# Patient Record
Sex: Male | Born: 1975 | Race: Black or African American | Hispanic: No | Marital: Single | Smoking: Former smoker
Health system: Southern US, Community
[De-identification: ages and names within clinical notes are randomized; demographics above are authoritative.]

---

## 2018-08-18 ENCOUNTER — Encounter (HOSPITAL_COMMUNITY): Payer: Self-pay

## 2018-08-18 ENCOUNTER — Other Ambulatory Visit: Payer: Self-pay

## 2018-08-18 ENCOUNTER — Emergency Department (HOSPITAL_COMMUNITY): Payer: Self-pay

## 2018-08-18 ENCOUNTER — Emergency Department (HOSPITAL_COMMUNITY)
Admission: EM | Admit: 2018-08-18 | Discharge: 2018-08-18 | Disposition: A | Discharge status: Home / Self Care | Payer: Self-pay | Attending: Emergency Medicine | Admitting: Emergency Medicine

## 2018-08-18 DIAGNOSIS — R0789 Other chest pain: Secondary | ICD-10-CM | POA: Insufficient documentation

## 2018-08-18 LAB — BASIC METABOLIC PANEL
Anion gap: 9 (ref 5–15)
BUN: 8 mg/dL (ref 6–20)
CO2: 21 mmol/L — ABNORMAL LOW (ref 22–32)
Calcium: 8.4 mg/dL — ABNORMAL LOW (ref 8.9–10.3)
Chloride: 104 mmol/L (ref 98–111)
Creatinine, Ser: 1.28 mg/dL — ABNORMAL HIGH (ref 0.61–1.24)
GFR calc Af Amer: >60 mL/min (ref >60)
GFR calc non Af Amer: >60 mL/min (ref >60)
Glucose, Bld: 126 mg/dL — ABNORMAL HIGH (ref 70–99)
Potassium: 3.4 mmol/L — ABNORMAL LOW (ref 3.5–5.1)
Sodium: 134 mmol/L — ABNORMAL LOW (ref 135–145)

## 2018-08-18 LAB — CBC
HCT: 43.6 % (ref 39.0–52.0)
Hemoglobin: 14.6 g/dL (ref 13.0–17.0)
MCH: 29.3 pg (ref 26.0–34.0)
MCHC: 33.5 g/dL (ref 30.0–36.0)
MCV: 87.4 fL (ref 80.0–100.0)
Platelets: 209 10*3/uL (ref 150–400)
RBC: 4.99 MIL/uL (ref 4.22–5.81)
RDW: 13.7 % (ref 11.5–15.5)
WBC: 2.8 10*3/uL — ABNORMAL LOW (ref 4.0–10.5)
nRBC: 0 % (ref 0.0–0.2)

## 2018-08-18 LAB — TROPONIN I (HIGH SENSITIVITY): Troponin I (High Sensitivity): 2 ng/L (ref <18)

## 2018-08-18 MED ORDER — NAPROXEN 250 MG PO TABS
500.0000 mg | ORAL_TABLET | Freq: Once | ORAL | Status: AC
  Administered 2018-08-18: 500 mg via ORAL
  Filled 2018-08-18: qty 2

## 2018-08-18 MED ORDER — NAPROXEN 500 MG PO TABS: 500.0000 mg | ORAL_TABLET | Freq: Two times a day (BID) | ORAL | 0 refills | Status: AC

## 2018-08-18 MED ORDER — SODIUM CHLORIDE 0.9% FLUSH: 3.0000 mL | Freq: Once | INTRAVENOUS | Status: DC

## 2018-08-18 NOTE — ED Notes (Signed)
Patient Alert and oriented to baseline. Stable and ambulatory to baseline. Patient verbalized understanding of the discharge instructions.  Patient belongings were taken by the patient.   

## 2018-08-18 NOTE — ED Triage Notes (Signed)
Pt reports chest soreness for months now but has gotten worse over the past few days, states it is worse with movement, when he sneezes or coughs. Pt a.o, nad noted at this time.

## 2018-08-18 NOTE — ED Provider Notes (Signed)
[facility]   CSN: [id number] Arrival date & time: 08/18/18  2030    History   Chief Complaint Chief Complaint  Patient presents with  . Chest Pain    HPI Cole Watkins is a 43 y.o. male.  HPI: A 43 year old patient presents for evaluation of chest pain. Initial onset of pain was more than 6 hours ago. The patient's chest pain is sharp and is not worse with exertion. The patient's chest pain is not middle- or left-sided, is not well-localized, is not described as heaviness/pressure/tightness and does not radiate to the arms/jaw/neck. The patient does not complain of nausea and denies diaphoresis. The patient has smoked in the past 90 days. The patient has no history of stroke, has no history of peripheral artery disease, denies any history of treated diabetes, has no relevant family history of coronary artery disease (first degree relative at less than age 41), is not hypertensive, has no history of hypercholesterolemia and does not have an elevated BMI (>=30).   HPI Pt has been having chest pain for the last 6 months.  The pain occurs daily.  It feels like a soreness.  Stretching, certain movements, sneezing and coughing all cause the pain.  No fevers.  No leg swelling.  No history of heart or lung disease.  History reviewed. No pertinent past medical history.  There are no active problems to display for this patient.   History reviewed. No pertinent surgical history.      Home Medications    Prior to Admission medications   Medication Sig Start Date End Date Taking? Authorizing Provider  ibuprofen (ADVIL) 200 MG tablet Take 400 mg by mouth every 6 (six) hours as needed for headache.   Yes [provider]  naproxen (NAPROSYN) 500 MG tablet Take 1 tablet (500 mg total) by mouth 2 (two) times daily. 08/18/18   Linwood DibblesKnapp, Deshon Koslowski, MD    Family History No family history on file.  Social History Social History   Tobacco Use   . Smoking status: Not on file  Substance Use Topics  . Alcohol use: Not on file  . Drug use: Not on file     Allergies   Patient has no allergy information on record.   Review of Systems Review of Systems  All other systems reviewed and are negative.    Physical Exam Updated Vital Signs BP 130/87   Pulse (!) 59   Temp 98.1 F (36.7 C) (Oral)   Resp 16   SpO2 100%   Physical Exam Vitals signs and nursing note reviewed.  Constitutional:      General: He is not in acute distress.    Appearance: He is well-developed.  HENT:     Head: Normocephalic and atraumatic.     Right Ear: External ear normal.     Left Ear: External ear normal.  Eyes:     General: No scleral icterus.       Right eye: No discharge.        Left eye: No discharge.     Conjunctiva/sclera: Conjunctivae normal.  Neck:     Musculoskeletal: Neck supple.     Trachea: No tracheal deviation.  Cardiovascular:     Rate and Rhythm: Normal rate and regular rhythm.  Pulmonary:     Effort: Pulmonary effort is normal. No respiratory distress.     Breath sounds: Normal breath sounds. No stridor. No wheezing or rales.  Chest:     Chest wall:  Tenderness present. No mass or deformity.  Abdominal:     General: Bowel sounds are normal. There is no distension.     Palpations: Abdomen is soft.     Tenderness: There is no abdominal tenderness. There is no guarding or rebound.  Musculoskeletal:        General: No tenderness.  Skin:    General: Skin is warm and dry.     Findings: No rash.  Neurological:     Mental Status: He is alert.     Cranial Nerves: No cranial nerve deficit (no facial droop, extraocular movements intact, no slurred speech).     Sensory: No sensory deficit.     Motor: No abnormal muscle tone or seizure activity.     Coordination: Coordination normal.      ED Treatments / Results  Labs (all labs ordered are listed, but only abnormal results are displayed) Labs Reviewed  BASIC  METABOLIC PANEL - Abnormal; Notable for the following components:      Result Value   Sodium 134 (*)    Potassium 3.4 (*)    CO2 21 (*)    Glucose, Bld 126 (*)    Creatinine, Ser 1.28 (*)    Calcium 8.4 (*)    All other components within normal limits  CBC - Abnormal; Notable for the following components:   WBC 2.8 (*)    All other components within normal limits  TROPONIN I (HIGH SENSITIVITY)    EKG EKG Interpretation  Date/Time:  Tuesday August 18 2018 20:40:56 EDT Ventricular Rate:  96 PR Interval:  156 QRS Duration: 76 QT Interval:  348 QTC Calculation: 439 R Axis:   84 Text Interpretation:  Normal sinus rhythm Biatrial enlargement Abnormal ECG No old tracing to compare Confirmed by Dorie Rank [phone]) on 08/18/2018 10:38:31 PM   Radiology Dg Chest 2 View  Result Date: 08/18/2018 CLINICAL DATA:  Central and left chest pain for the past 6 months, worse over the past few days. Worse with movement or when sneezing or coughing. Slight cough and shortness of breath for several days. Smoker. EXAM: CHEST - 2 VIEW COMPARISON:  Chest and right ribs report dated 09/17/2010. FINDINGS: The heart size and mediastinal contours are within normal limits. Both lungs are clear. The visualized skeletal structures are unremarkable. IMPRESSION: Normal examination. Electronically Signed   By: Claudie Revering M.D.   On: 08/18/2018 20:56    Procedures Procedures (including critical care time)  Medications Ordered in ED Medications  sodium chloride flush (NS) 0.9 % injection 3 mL (has no administration in time range)  naproxen (NAPROSYN) tablet 500 mg (has no administration in time range)     Initial Impression / Assessment and Plan / ED Course  I have reviewed the triage vital signs and the nursing notes.  Pertinent labs & imaging results that were available during my care of the patient were reviewed by me and considered in my medical decision making (see chart for details).   HEAR Score: 2   Patient has had 6 months of chest pain.  On exam he has chest wall tenderness.  His ED work-up is reassuring.  He is low risk hear score with negative troponin.  Chest x-ray does not show any acute abnormalities.  Low risk for PE and PERC negative.  Symptoms may be related to chest wall pain.  Plan on discharge home with a prescription for Naprosyn.  Discussed outpatient follow-up with a primary care doctor.  Final Clinical Impressions(s) / ED Diagnoses  Final diagnoses:  Chest wall pain    ED Discharge Orders         Ordered    naproxen (NAPROSYN) 500 MG tablet  2 times daily     08/18/18 2254           Linwood DibblesKnapp, Annette Liotta, MD 08/18/18 2259

## 2018-08-18 NOTE — Discharge Instructions (Addendum)
Take the medications as prescribed, follow-up with a primary care doctor as discussed

## 2020-02-29 IMAGING — DX CHEST - 2 VIEW
2 series · 2 of 2 positions shown · non-contrast
Comparison: Chest and right ribs report dated 09/17/2010.

CLINICAL DATA: Central and left chest pain for the past 6 months,
worse over the past few days. Worse with movement or when sneezing
or coughing. Slight cough and shortness of breath for several days.
Smoker.

EXAM:
CHEST - 2 VIEW

[chest pa]
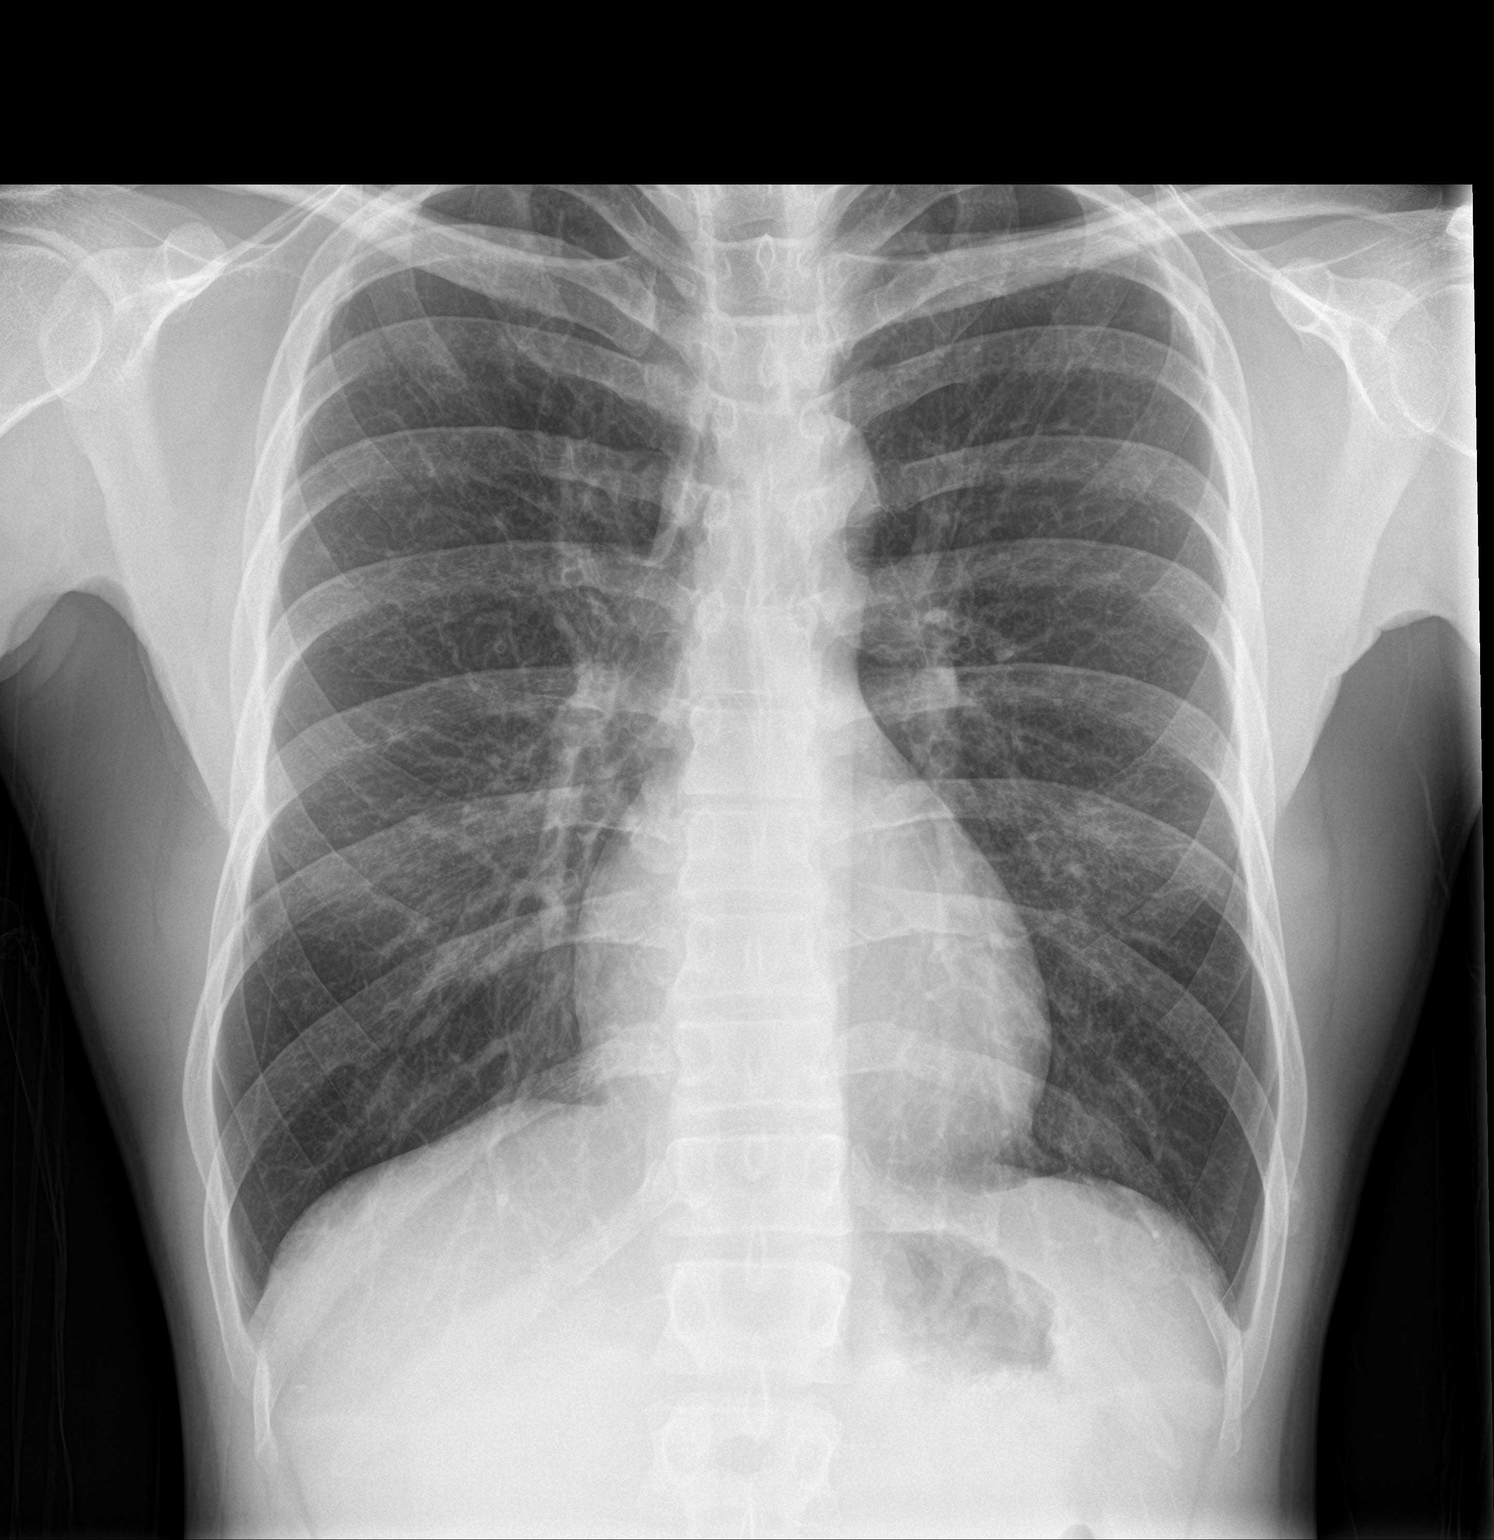

[chest lat]
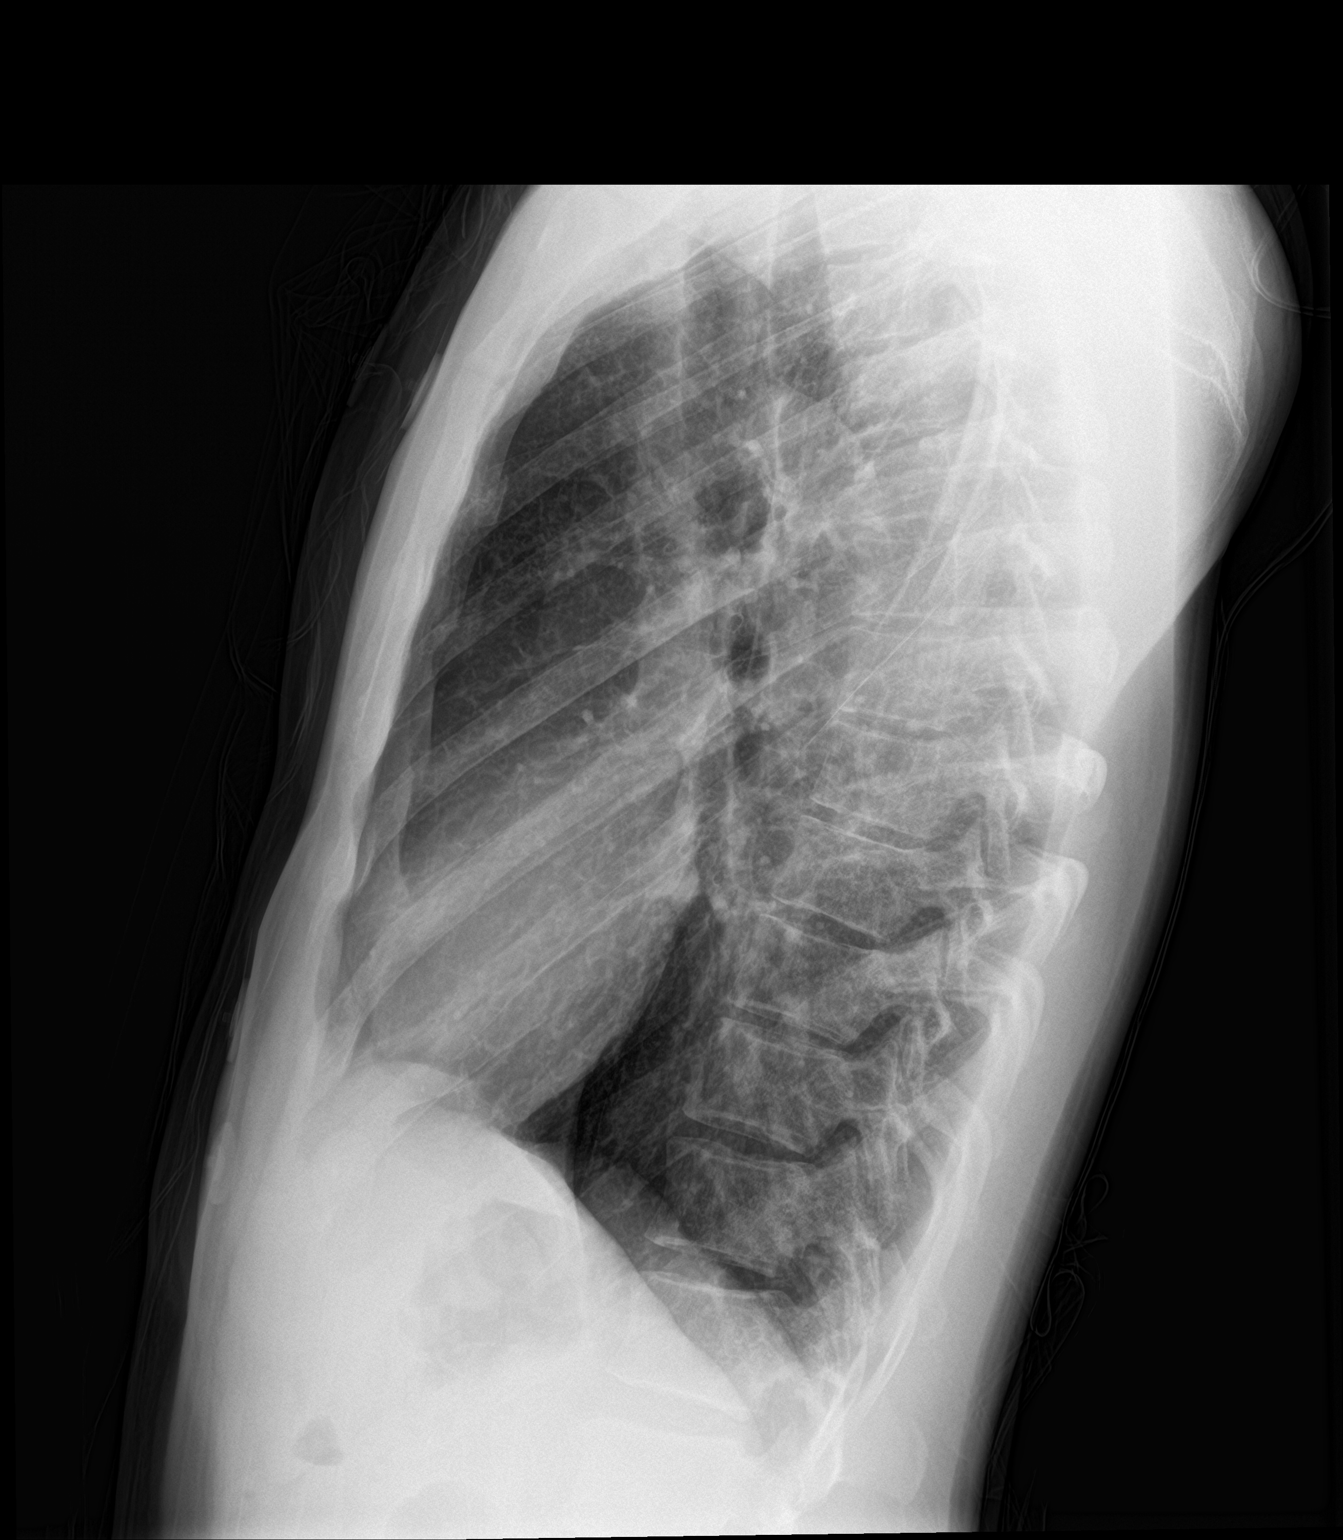

[2 of 2 positions shown; findings below may reference images not displayed]

FINDINGS: The heart size and mediastinal contours are within normal limits.
Both lungs are clear. The visualized skeletal structures are
unremarkable.
IMPRESSION: Normal examination.

## 2021-03-23 ENCOUNTER — Encounter (HOSPITAL_COMMUNITY): Payer: Self-pay | Admitting: Emergency Medicine

## 2021-03-23 ENCOUNTER — Other Ambulatory Visit: Payer: Self-pay

## 2021-03-23 ENCOUNTER — Emergency Department (HOSPITAL_COMMUNITY)
Admission: EM | Admit: 2021-03-23 | Discharge: 2021-03-24 | Disposition: A | Discharge status: Home / Self Care | Payer: Self-pay | Attending: Emergency Medicine | Admitting: Emergency Medicine

## 2021-03-23 DIAGNOSIS — S61216A Laceration without foreign body of right little finger without damage to nail, initial encounter: Secondary | ICD-10-CM | POA: Insufficient documentation

## 2021-03-23 DIAGNOSIS — W268XXA Contact with other sharp object(s), not elsewhere classified, initial encounter: Secondary | ICD-10-CM | POA: Insufficient documentation

## 2021-03-23 DIAGNOSIS — Y990 Civilian activity done for income or pay: Secondary | ICD-10-CM | POA: Insufficient documentation

## 2021-03-23 DIAGNOSIS — Z23 Encounter for immunization: Secondary | ICD-10-CM | POA: Insufficient documentation

## 2021-03-23 MED ORDER — TETANUS-DIPHTH-ACELL PERTUSSIS 5-2.5-18.5 LF-MCG/0.5 IM SUSY
0.5000 mL | PREFILLED_SYRINGE | Freq: Once | INTRAMUSCULAR | Status: AC
  Administered 2021-03-23: 0.5 mL via INTRAMUSCULAR
  Filled 2021-03-23: qty 0.5

## 2021-03-23 NOTE — ED Provider Notes (Signed)
[facility] EMERGENCY DEPARTMENT Provider Note   CSN: [id number] Arrival date & time: 03/23/21  2225     History  Chief Complaint  Patient presents with   Laceration    Cole Watkins is a 46 y.o. male.  46 year old RHD male presents to the emergency department for evaluation of laceration over the fifth MCP joint of the right hand.  Reports dropping a plate that broke and cut his knuckle.  Bleeding controlled prior to arrival with pressure and gauze.  No significant pain at the area.  Reports full range of motion of the right hand and digits.  No medications prior to arrival.  Unsure of the date of his last tetanus shot.  The history is provided by the patient. No language interpreter was used.  Laceration     Home Medications Prior to Admission medications   Medication Sig Start Date End Date Taking? Authorizing Provider  ibuprofen (ADVIL) 200 MG tablet Take 400 mg by mouth every 6 (six) hours as needed for headache.    [provider]  naproxen (NAPROSYN) 500 MG tablet Take 1 tablet (500 mg total) by mouth 2 (two) times daily. 08/18/18   Dorie Rank, MD      Allergies    Patient has no known allergies.    Review of Systems   Review of Systems Ten systems reviewed and are negative for acute change, except as noted in the HPI.    Physical Exam Updated Vital Signs BP (!) 127/91    Pulse 71    Temp 98.4 F (36.9 C) (Oral)    Resp 16    SpO2 99%   Physical Exam Vitals and nursing note reviewed.  Constitutional:      General: He is not in acute distress.    Appearance: He is well-developed. He is not diaphoretic.     Comments: Nontoxic-appearing and in no distress  HENT:     Head: Normocephalic and atraumatic.  Eyes:     General: No scleral icterus.    Conjunctiva/sclera: Conjunctivae normal.  Cardiovascular:     Rate and Rhythm: Normal rate and regular rhythm.     Pulses: Normal pulses.     Comments: Distal radial pulse 2+ in the right  upper extremity.  Capillary refill brisk in all digits of the right hand. Pulmonary:     Effort: Pulmonary effort is normal. No respiratory distress.  Musculoskeletal:        General: Normal range of motion.     Cervical back: Normal range of motion.     Comments: Full range of motion of right hand and all digits.  5/5 strength against resistance of FDP, FDS, extensors of the right fifth finger.  Laceration of 2 cm overlying the MCP joint.  No foreign bodies visualized or palpated.  No active bleeding.  Skin:    General: Skin is warm and dry.     Coloration: Skin is not pale.     Findings: No erythema or rash.  Neurological:     Mental Status: He is alert and oriented to person, place, and time.  Psychiatric:        Behavior: Behavior normal.    ED Results / Procedures / Treatments   Labs (all labs ordered are listed, but only abnormal results are displayed) Labs Reviewed - No data to display  EKG None  Radiology No results found.  Procedures .Marland KitchenLaceration Repair  Date/Time: 03/24/2021 12:08 AM Performed by: Antonietta Breach, PA-C Authorized by: Antonietta Breach,  PA-C   Consent:    Consent obtained:  Verbal and emergent situation   Consent given by:  Patient   Risks, benefits, and alternatives were discussed: yes     Risks discussed:  Infection, need for additional repair, poor cosmetic result, poor wound healing and pain Universal protocol:    Patient identity confirmed:  Verbally with patient and arm band Anesthesia:    Anesthesia method:  None Laceration details:    Location:  Finger   Finger location:  R small finger   Length (cm):  2 Pre-procedure details:    Preparation:  Patient was prepped and draped in usual sterile fashion Exploration:    Hemostasis achieved with:  Direct pressure   Wound exploration: wound explored through full range of motion     Contaminated: no   Treatment:    Amount of cleaning:  Standard   Irrigation method:  Tap   Debridement:   None Skin repair:    Repair method:  Steri-Strips and tissue adhesive   Number of Steri-Strips:  2 Approximation:    Approximation:  Close Repair type:    Repair type:  Simple Post-procedure details:    Dressing: gauze dressing.   Procedure completion:  Tolerated well, no immediate complications    Medications Ordered in ED Medications  Tdap (BOOSTRIX) injection 0.5 mL (0.5 mLs Intramuscular Given 03/23/21 2331)    ED Course/ Medical Decision Making/ A&P                           Medical Decision Making Risk Prescription drug management.   This patient presents to the ED for concern of laceration to the R hand, this involves an extensive number of treatment options, and is a complaint that carries with it a high risk of complications and morbidity.  The differential diagnosis includes superficial vs deep laceration vs tendon    Additional history obtained:  External records from outside source obtained and reviewed including Care Everywhere   Medicines ordered and prescription drug management:  I ordered medication including dermabond for laceration repair  Reevaluation of the patient after these medicines showed that the patient improved I have reviewed the patients home medicines and have made adjustments as needed   Reevaluation:  After the interventions noted above, I reevaluated the patient and found that they have :improved   Social Determinants of Health:  Employed    Dispostion:  After consideration of the diagnostic results and the patients response to treatment, I feel that the patent would benefit from discharge with wound care instructions.   Tdap booster given. Laceration occurred < 8 hours prior to repair which was well tolerated. Pt has no comorbidities to effect normal wound healing. Discussed suture home care with pt and answered questions. Pt to follow up for wound check PRN. Patient is hemodynamically stable with no complaints prior to  discharge.          Final Clinical Impression(s) / ED Diagnoses Final diagnoses:  Laceration of right little finger without foreign body without damage to nail, initial encounter    Rx / DC Orders ED Discharge Orders     None         Antonietta Breach, PA-C 03/24/21 0009    Ripley Fraise, MD 03/24/21 [phone]

## 2021-03-23 NOTE — Discharge Instructions (Addendum)
Avoid soaking your wound in stagnant or dirty water such as while taking a bath. You can shower normally. Keep the area clean with mild soap and warm water. Do not apply peroxide or alcohol to your wound as this can break down newly forming skin and prolong wound healing. If you keep the area bandaged, change the dressing/bandage at least once per day. 

## 2021-03-23 NOTE — ED Triage Notes (Signed)
Patient was at work, dropped a plate and was cut on the knuckle of right hand, below right pinky.  Bleeding controlled at this time.

## 2022-12-17 ENCOUNTER — Emergency Department (HOSPITAL_COMMUNITY): Payer: 59

## 2022-12-17 ENCOUNTER — Emergency Department (HOSPITAL_COMMUNITY)
Admission: EM | Admit: 2022-12-17 | Discharge: 2022-12-17 | Disposition: A | Discharge status: Home / Self Care | Payer: 59 | Attending: Emergency Medicine | Admitting: Emergency Medicine

## 2022-12-17 ENCOUNTER — Encounter (HOSPITAL_COMMUNITY): Payer: Self-pay

## 2022-12-17 ENCOUNTER — Other Ambulatory Visit: Payer: Self-pay

## 2022-12-17 DIAGNOSIS — R109 Unspecified abdominal pain: Secondary | ICD-10-CM | POA: Diagnosis not present

## 2022-12-17 DIAGNOSIS — R112 Nausea with vomiting, unspecified: Secondary | ICD-10-CM | POA: Diagnosis not present

## 2022-12-17 DIAGNOSIS — K429 Umbilical hernia without obstruction or gangrene: Secondary | ICD-10-CM | POA: Diagnosis not present

## 2022-12-17 DIAGNOSIS — R1084 Generalized abdominal pain: Secondary | ICD-10-CM | POA: Insufficient documentation

## 2022-12-17 LAB — COMPREHENSIVE METABOLIC PANEL
ALT: 12 U/L (ref 0–44)
AST: 16 U/L (ref 15–41)
Albumin: 3 g/dL — ABNORMAL LOW (ref 3.5–5.0)
Alkaline Phosphatase: 48 U/L (ref 38–126)
Anion gap: 8 (ref 5–15)
BUN: 9 mg/dL (ref 6–20)
CO2: 21 mmol/L — ABNORMAL LOW (ref 22–32)
Calcium: 8 mg/dL — ABNORMAL LOW (ref 8.9–10.3)
Chloride: 106 mmol/L (ref 98–111)
Creatinine, Ser: 0.79 mg/dL (ref 0.61–1.24)
GFR, Estimated: >60 mL/min (ref >60)
Glucose, Bld: 133 mg/dL — ABNORMAL HIGH (ref 70–99)
Potassium: 3.4 mmol/L — ABNORMAL LOW (ref 3.5–5.1)
Sodium: 135 mmol/L (ref 135–145)
Total Bilirubin: 0.6 mg/dL (ref <1.2)
Total Protein: 5.5 g/dL — ABNORMAL LOW (ref 6.5–8.1)

## 2022-12-17 LAB — RAPID URINE DRUG SCREEN, HOSP PERFORMED
Amphetamines: NOT DETECTED
Barbiturates: NOT DETECTED
Benzodiazepines: NOT DETECTED
Cocaine: NOT DETECTED
Opiates: NOT DETECTED
Tetrahydrocannabinol: POSITIVE — AB

## 2022-12-17 LAB — CBC WITH DIFFERENTIAL/PLATELET
Abs Immature Granulocytes: 0.02 10*3/uL (ref 0.00–0.07)
Basophils Absolute: 0 10*3/uL (ref 0.0–0.1)
Basophils Relative: 1 %
Eosinophils Absolute: 0 10*3/uL (ref 0.0–0.5)
Eosinophils Relative: 1 %
HCT: 44.3 % (ref 39.0–52.0)
Hemoglobin: 14.6 g/dL (ref 13.0–17.0)
Immature Granulocytes: 0 %
Lymphocytes Relative: 14 %
Lymphs Abs: 0.8 10*3/uL (ref 0.7–4.0)
MCH: 28.2 pg (ref 26.0–34.0)
MCHC: 33 g/dL (ref 30.0–36.0)
MCV: 85.7 fL (ref 80.0–100.0)
Monocytes Absolute: 0.6 10*3/uL (ref 0.1–1.0)
Monocytes Relative: 10 %
Neutro Abs: 4.2 10*3/uL (ref 1.7–7.7)
Neutrophils Relative %: 74 %
Platelets: 238 10*3/uL (ref 150–400)
RBC: 5.17 MIL/uL (ref 4.22–5.81)
RDW: 13.1 % (ref 11.5–15.5)
WBC: 5.6 10*3/uL (ref 4.0–10.5)
nRBC: 0 % (ref 0.0–0.2)

## 2022-12-17 LAB — URINALYSIS, ROUTINE W REFLEX MICROSCOPIC
Bilirubin Urine: NEGATIVE
Glucose, UA: NEGATIVE mg/dL
Hgb urine dipstick: NEGATIVE
Ketones, ur: 5 mg/dL — AB
Leukocytes,Ua: NEGATIVE
Nitrite: NEGATIVE
Protein, ur: NEGATIVE mg/dL
Specific Gravity, Urine: 1.011 (ref 1.005–1.030)
pH: 6 (ref 5.0–8.0)

## 2022-12-17 LAB — LIPASE, BLOOD: Lipase: 22 U/L (ref 11–51)

## 2022-12-17 MED ORDER — IOHEXOL 300 MG/ML  SOLN
100.0000 mL | Freq: Once | INTRAMUSCULAR | Status: AC | PRN
  Administered 2022-12-17: 100 mL via INTRAVENOUS

## 2022-12-17 MED ORDER — ONDANSETRON HCL 4 MG PO TABS
4.0000 mg | ORAL_TABLET | Freq: Four times a day (QID) | ORAL | 0 refills | Status: AC
Start: 2022-12-17 — End: ?

## 2022-12-17 MED ORDER — ONDANSETRON HCL 4 MG/2ML IJ SOLN
4.0000 mg | Freq: Once | INTRAMUSCULAR | Status: AC
  Administered 2022-12-17: 4 mg via INTRAVENOUS
  Filled 2022-12-17: qty 2

## 2022-12-17 MED ORDER — SODIUM CHLORIDE 0.9 % IV BOLUS
1000.0000 mL | Freq: Once | INTRAVENOUS | Status: AC
  Administered 2022-12-17: 1000 mL via INTRAVENOUS

## 2022-12-17 MED ORDER — LORAZEPAM 2 MG/ML IJ SOLN
0.5000 mg | Freq: Once | INTRAMUSCULAR | Status: AC
  Administered 2022-12-17: 0.5 mg via INTRAVENOUS
  Filled 2022-12-17: qty 1

## 2022-12-17 NOTE — ED Provider Notes (Signed)
[place] EMERGENCY DEPARTMENT AT [facility] Provider Note   CSN: [id number] Arrival date & time: 12/17/22  1631     History  Chief Complaint  Patient presents with   Abdominal Pain    Cole Watkins is a 47 y.o. male.  47 year old male with prior medical history as detailed below presents for evaluation.  Patient reports ongoing issues over the last month with nausea, vomiting, diffuse abdominal cramps.  Denies fevers.  He has not yet seen a provider or been evaluated for this complaint.  The history is provided by the patient and medical records. No language interpreter was used.       Home Medications Prior to Admission medications   Medication Sig Start Date End Date Taking? Authorizing Provider  ibuprofen (ADVIL) 200 MG tablet Take 400 mg by mouth every 6 (six) hours as needed for headache.    [provider]  naproxen (NAPROSYN) 500 MG tablet Take 1 tablet (500 mg total) by mouth 2 (two) times daily. 08/18/18   Linwood Dibbles, MD      Allergies    Penicillins    Review of Systems   Review of Systems  All other systems reviewed and are negative.   Physical Exam Updated Vital Signs BP (!) 129/92   Pulse 89   Temp 98.7 F (37.1 C) (Oral)   Resp 13   Ht 6' (1.829 m)   Wt 68 kg   SpO2 99%   BMI 20.34 kg/m  Physical Exam Vitals and nursing note reviewed.  Constitutional:      General: He is not in acute distress.    Appearance: Normal appearance. He is well-developed.  HENT:     Head: Normocephalic and atraumatic.  Eyes:     Conjunctiva/sclera: Conjunctivae normal.     Pupils: Pupils are equal, round, and reactive to light.  Cardiovascular:     Rate and Rhythm: Normal rate and regular rhythm.     Heart sounds: Normal heart sounds.  Pulmonary:     Effort: Pulmonary effort is normal. No respiratory distress.     Breath sounds: Normal breath sounds.  Abdominal:     General: There is no distension.     Palpations: Abdomen is soft.      Tenderness: There is generalized abdominal tenderness.  Musculoskeletal:        General: No deformity. Normal range of motion.     Cervical back: Normal range of motion and neck supple.  Skin:    General: Skin is warm and dry.  Neurological:     General: No focal deficit present.     Mental Status: He is alert and oriented to person, place, and time.     ED Results / Procedures / Treatments   Labs (all labs ordered are listed, but only abnormal results are displayed) Labs Reviewed  COMPREHENSIVE METABOLIC PANEL - Abnormal; Notable for the following components:      Result Value   Potassium 3.4 (*)    CO2 21 (*)    Glucose, Bld 133 (*)    Calcium 8.0 (*)    Total Protein 5.5 (*)    Albumin 3.0 (*)    All other components within normal limits  URINALYSIS, ROUTINE W REFLEX MICROSCOPIC - Abnormal; Notable for the following components:   Ketones, ur 5 (*)    All other components within normal limits  CBC WITH DIFFERENTIAL/PLATELET  LIPASE, BLOOD  RAPID URINE DRUG SCREEN, HOSP PERFORMED    EKG None  Radiology  No results found.  Procedures Procedures    Medications Ordered in ED Medications  sodium chloride 0.9 % bolus 1,000 mL (1,000 mLs Intravenous New Bag/Given 12/17/22 1926)  ondansetron (ZOFRAN) injection 4 mg (4 mg Intravenous Given 12/17/22 1927)  iohexol (OMNIPAQUE) 300 MG/ML solution 100 mL (100 mLs Intravenous Contrast Given 12/17/22 1934)    ED Course/ Medical Decision Making/ A&P                                 Medical Decision Making Amount and/or Complexity of Data Reviewed Labs: ordered. Radiology: ordered.  Risk Prescription drug management.    Medical Screen Complete  This patient presented to the ED with complaint of abdominal pain, chronic.  This complaint involves an extensive number of treatment options. The initial differential diagnosis includes, but is not limited to, metabolic abnormality, intra-abdominal infection, obstruction,  etc.  This presentation is: Chronic, Self-Limited, Previously Undiagnosed, Uncertain Prognosis, Complicated, and Systemic Symptoms  Patient is presenting with complaint of chronic abdominal pain and discomfort.  Patient's ongoing symptoms have been present for at least 1 month.  Patient is improved after IV fluids and antiemetics.  Notable abnormalities on workup include presence of THC on patient's tox screen.  Patient is unable to provide clear correlation between marijuana use and is abdominal discomfort and associated nausea and vomiting.  Patient educated about cannabinoid hyperemesis syndrome.  Patient is advised to trial not using marijuana and see if his symptoms improve.  CT abdomen pelvis does not demonstrate acute abnormality.  Incidental finding of lucency in the left iliac bone discussed extensively with the patient.  Patient is aware of need for further workup.  Strict return precautions given understood.  Portance close follow-up stressed.  Additional history obtained:  External records from outside sources obtained and reviewed including prior ED visits and prior Inpatient records.    Lab Tests:  I ordered and personally interpreted labs.  The pertinent results include: CBC, CMP, lipase, UA, urine tox screen   Imaging Studies ordered:  I ordered imaging studies including CT abdomen pelvis I independently visualized and interpreted obtained imaging which showed NAD I agree with the radiologist interpretation.   Cardiac Monitoring:  The patient was maintained on a cardiac monitor.  I personally viewed and interpreted the cardiac monitor which showed an underlying rhythm of: NSR   Medicines ordered:  I ordered medication including Zofran, Ativan for nausea, vomiting Reevaluation of the patient after these medicines showed that the patient: improved   Problem List / ED Course:  Nausea, vomiting.   Reevaluation:  After the interventions noted above, I  reevaluated the patient and found that they have: improved  Disposition:  After consideration of the diagnostic results and the patients response to treatment, I feel that the patent would benefit from close outpatient followup.          Final Clinical Impression(s) / ED Diagnoses Final diagnoses:  Nausea and vomiting, unspecified vomiting type    Rx / DC Orders ED Discharge Orders          Ordered    ondansetron (ZOFRAN) 4 MG tablet  Every 6 hours        12/17/22 2241              Wynetta Fines, MD 12/17/22 2245

## 2022-12-17 NOTE — ED Triage Notes (Signed)
C/o generalized abd pain raiding into left ribs, headache, n/v, and fatigue x1 month.

## 2022-12-17 NOTE — ED Provider Triage Note (Signed)
Emergency Medicine Provider Triage Evaluation Note  Tanay Pomeranz , a 47 y.o. male  was evaluated in triage.  Pt complains of abdominal pain.  Review of Systems  Positive: Bloating, infrequent nausea Negative: Fever, diarrhea, constipation  Physical Exam  BP (!) 137/94 (BP Location: Left Arm)   Pulse 93   Temp 98.7 F (37.1 C) (Oral)   Resp 16   Ht 6' (1.829 m)   Wt 68 kg   SpO2 100%   BMI 20.34 kg/m  Gen:   Awake, no distress   Resp:  Normal effort  MSK:   Moves extremities without difficulty  Other:  Diffuse abdominal tenderness  Medical Decision Making  Medically screening exam initiated at 4:44 PM.  Appropriate orders placed.  Joaquin Courts was informed that the remainder of the evaluation will be completed by another provider, this initial triage assessment does not replace that evaluation, and the importance of remaining in the ED until their evaluation is complete.  Diffuse abdominal pain and bloating for one month, extending around to left back. No fever. Not a daily drinker. Otherwise healthy. No urinary symptoms.    Elpidio Anis, PA-C 12/17/22 1646

## 2022-12-17 NOTE — Discharge Instructions (Addendum)
Return for any problem.   As discussed, the lucency seen on the left iliac bone  (your left hip area) on CT may benefit from further workup in the outpatient setting.  Outpatient care providers may want to obtain an MRI at some point to closely evaluate this area.  Close follow-up with a PCP is recommended.
